# Patient Record
Sex: Female | Born: 1940 | Race: Black or African American | Hispanic: No | Smoking: Never smoker
Health system: Southern US, Community
[De-identification: ages and names within clinical notes are randomized; demographics above are authoritative.]

## PROBLEM LIST (undated history)

## (undated) DIAGNOSIS — I10 Essential (primary) hypertension: Secondary | ICD-10-CM

## (undated) DIAGNOSIS — E785 Hyperlipidemia, unspecified: Secondary | ICD-10-CM

## (undated) HISTORY — DX: Essential (primary) hypertension: I10

## (undated) HISTORY — DX: Hyperlipidemia, unspecified: E78.5

---

## 1985-05-27 HISTORY — PX: ABDOMINAL HYSTERECTOMY: SHX81

## 2004-03-12 ENCOUNTER — Ambulatory Visit: Payer: Self-pay | Admitting: Family Medicine

## 2004-07-31 ENCOUNTER — Ambulatory Visit: Payer: Self-pay | Admitting: Family Medicine

## 2004-09-14 ENCOUNTER — Ambulatory Visit: Payer: Self-pay | Admitting: Internal Medicine

## 2005-08-01 ENCOUNTER — Ambulatory Visit: Payer: Self-pay | Admitting: Family Medicine

## 2011-02-07 ENCOUNTER — Encounter (INDEPENDENT_AMBULATORY_CARE_PROVIDER_SITE_OTHER): Payer: Self-pay

## 2011-02-11 ENCOUNTER — Encounter (INDEPENDENT_AMBULATORY_CARE_PROVIDER_SITE_OTHER): Payer: Self-pay | Admitting: General Surgery

## 2011-02-11 ENCOUNTER — Ambulatory Visit (INDEPENDENT_AMBULATORY_CARE_PROVIDER_SITE_OTHER): Payer: Medicare Other | Admitting: General Surgery

## 2011-02-11 VITALS — BP 144/86 | HR 60 | Temp 97.0°F | Ht 65.0 in | Wt 134.5 lb

## 2011-02-11 DIAGNOSIS — D235 Other benign neoplasm of skin of trunk: Secondary | ICD-10-CM

## 2011-02-11 NOTE — Progress Notes (Signed)
Chief Complaint  Patient presents with  . Other    new pt eval of lipoma of upper right chest     HPI Deanna Carey is a 70 y.o. female.   HPI    She is self-referred. She has a soft tissue mass on the right upper chest wall in the infraclavicular region. She saw Dr. Francina Ames in 1999 for this. It was 6 cm at the time. It was decided not to excise it at that time. She states it is slowly growing. She is interested in having it removed. Sometimes it causes her some right chest wall pain.  Past Medical History  Diagnosis Date  . Hypertension   . Hyperlipidemia     Past Surgical History  Procedure Date  . Abdominal hysterectomy 1987    Family History  Problem Relation Age of Onset  . Heart disease Mother   . Heart disease Father     Social History History  Substance Use Topics  . Smoking status: Never Smoker   . Smokeless tobacco: Not on file  . Alcohol Use: No    Allergies  Allergen Reactions  . Sulfa Antibiotics     Hives, swelling, breathing constricted     Current Outpatient Prescriptions  Medication Sig Dispense Refill  . aspirin 81 MG tablet Take 81 mg by mouth daily.        Marland Kitchen CALCIUM PO Take by mouth.        . cholecalciferol (VITAMIN D) 1000 UNITS tablet Take 1,000 Units by mouth daily.        . clorazepate (TRANXENE) 7.5 MG tablet Take 7.5 mg by mouth 2 (two) times daily as needed.        Marland Kitchen lisinopril (PRINIVIL,ZESTRIL) 5 MG tablet       . MAGNESIUM PO Take by mouth.        Marland Kitchen VITAMIN E PO Take by mouth.        . simvastatin (ZOCOR) 20 MG tablet         Review of Systems Review of Systems  Constitutional: Negative.   Cardiovascular: Positive for chest pain.       Intermittent pain at soft tissue mass site.    Blood pressure 144/86, pulse 60, temperature 97 F (36.1 C), height 5\' 5"  (1.651 m), weight 134 lb 8 oz (61.009 kg).  Physical Exam Physical Exam  Constitutional: She appears well-developed and well-nourished. No distress.  Neck: No  JVD present. No tracheal deviation present.  Pulmonary/Chest:       9 cm by 7 cm right infraclavicular soft tissue mass that is mobile.  No supraclavicular adenopathy.  No right breast mass.  Lymphadenopathy:    She has no cervical adenopathy.    Data Reviewed Old Chart.  Assessment    Enlarging right upper chest wall mass.    Plan    Excision of right upper chest wall mass.      The procedure and risks were discussed with her. Risks include but are not limited to bleeding, infection, wound problems, anesthesia, injury to nerve or blood vessel, recurrence. She seems to understand and would like to proceed.  Orange Hilligoss J 02/11/2011, 11:21 AM

## 2011-02-25 ENCOUNTER — Other Ambulatory Visit (INDEPENDENT_AMBULATORY_CARE_PROVIDER_SITE_OTHER): Payer: Self-pay | Admitting: General Surgery

## 2011-02-25 NOTE — Telephone Encounter (Signed)
Called pt to clarify that her Rx would be written at d/c after surgery.  She understands.

## 2011-03-07 ENCOUNTER — Ambulatory Visit
Admission: RE | Admit: 2011-03-07 | Discharge: 2011-03-07 | Disposition: A | Discharge status: Home / Self Care | Payer: Medicare Other | Source: Ambulatory Visit | Attending: General Surgery | Admitting: General Surgery

## 2011-03-07 ENCOUNTER — Other Ambulatory Visit (INDEPENDENT_AMBULATORY_CARE_PROVIDER_SITE_OTHER): Payer: Self-pay | Admitting: General Surgery

## 2011-03-07 DIAGNOSIS — Z01811 Encounter for preprocedural respiratory examination: Secondary | ICD-10-CM

## 2011-03-14 ENCOUNTER — Telehealth (INDEPENDENT_AMBULATORY_CARE_PROVIDER_SITE_OTHER): Payer: Self-pay

## 2011-03-14 DIAGNOSIS — D249 Benign neoplasm of unspecified breast: Secondary | ICD-10-CM

## 2011-03-14 HISTORY — PX: BREAST SURGERY: SHX581

## 2011-03-14 NOTE — Telephone Encounter (Signed)
Patient's daughter called and states that they were told to ice the incision and keep the dressing dry.  They have been using the ice pack and the dressing feels moist.  She is concerned.  I spoke to Dr Abbey Chatters and he advised stop the ice and put a dry towel over the dressing to absorb the moisture.  I notified the daughter.

## 2011-03-27 ENCOUNTER — Encounter (INDEPENDENT_AMBULATORY_CARE_PROVIDER_SITE_OTHER): Payer: Self-pay | Admitting: General Surgery

## 2011-03-27 ENCOUNTER — Ambulatory Visit (INDEPENDENT_AMBULATORY_CARE_PROVIDER_SITE_OTHER): Payer: Medicare Other | Admitting: General Surgery

## 2011-03-27 DIAGNOSIS — D179 Benign lipomatous neoplasm, unspecified: Secondary | ICD-10-CM

## 2011-03-27 NOTE — Progress Notes (Signed)
Operation:  Removal of right chest wall/breast mass  Date:  03/14/11  Pathology:  Benign lipoma  HPI:  Ms. Deanna Carey is here for her first postoperative visit.  She is doing well and has no complaints. She had minimal swelling.   Physical Exam:  Right upper chest wall incision is clean and intact.  Assessment:  Wound is healing well and pathology is benign.  Plan:  May shower, activities as tolerated, return visit p.r.n.

## 2011-03-27 NOTE — Patient Instructions (Signed)
The mass was a benign lipoma.  You may shower.  You may resume your normal activities.

## 2011-05-06 ENCOUNTER — Other Ambulatory Visit: Payer: Self-pay | Admitting: Radiology

## 2011-05-10 ENCOUNTER — Encounter (INDEPENDENT_AMBULATORY_CARE_PROVIDER_SITE_OTHER): Payer: Self-pay | Admitting: General Surgery

## 2012-03-23 ENCOUNTER — Other Ambulatory Visit: Payer: Self-pay | Admitting: Diagnostic Neuroimaging

## 2012-03-23 DIAGNOSIS — R439 Unspecified disturbances of smell and taste: Secondary | ICD-10-CM

## 2012-03-26 ENCOUNTER — Ambulatory Visit
Admission: RE | Admit: 2012-03-26 | Discharge: 2012-03-26 | Disposition: A | Discharge status: Home / Self Care | Payer: Medicare Other | Source: Ambulatory Visit | Attending: Diagnostic Neuroimaging | Admitting: Diagnostic Neuroimaging

## 2012-03-26 DIAGNOSIS — R439 Unspecified disturbances of smell and taste: Secondary | ICD-10-CM

## 2012-03-26 MED ORDER — GADOBENATE DIMEGLUMINE 529 MG/ML IV SOLN
10.0000 mL | Freq: Once | INTRAVENOUS | Status: AC | PRN
  Administered 2012-03-26: 10 mL via INTRAVENOUS

## 2014-09-26 ENCOUNTER — Other Ambulatory Visit: Payer: Self-pay

## 2014-09-26 DIAGNOSIS — Z1231 Encounter for screening mammogram for malignant neoplasm of breast: Secondary | ICD-10-CM

## 2016-03-14 ENCOUNTER — Other Ambulatory Visit: Payer: Self-pay | Admitting: Internal Medicine

## 2016-03-14 DIAGNOSIS — R0989 Other specified symptoms and signs involving the circulatory and respiratory systems: Secondary | ICD-10-CM

## 2016-04-23 ENCOUNTER — Ambulatory Visit
Admission: RE | Admit: 2016-04-23 | Discharge: 2016-04-23 | Disposition: A | Discharge status: Home / Self Care | Payer: Medicare Other | Source: Ambulatory Visit | Attending: Internal Medicine | Admitting: Internal Medicine

## 2016-04-23 DIAGNOSIS — R0989 Other specified symptoms and signs involving the circulatory and respiratory systems: Secondary | ICD-10-CM

## 2016-05-01 ENCOUNTER — Other Ambulatory Visit: Payer: Self-pay

## 2020-02-25 ENCOUNTER — Other Ambulatory Visit: Payer: Self-pay | Admitting: Internal Medicine

## 2020-02-25 DIAGNOSIS — R1310 Dysphagia, unspecified: Secondary | ICD-10-CM

## 2020-03-01 ENCOUNTER — Ambulatory Visit
Admission: RE | Admit: 2020-03-01 | Discharge: 2020-03-01 | Disposition: A | Discharge status: Home / Self Care | Payer: Medicare Other | Source: Ambulatory Visit | Attending: Internal Medicine | Admitting: Internal Medicine

## 2020-03-01 DIAGNOSIS — R1310 Dysphagia, unspecified: Secondary | ICD-10-CM

## 2020-06-07 ENCOUNTER — Encounter: Payer: Self-pay | Admitting: Hematology and Oncology

## 2020-06-07 ENCOUNTER — Telehealth: Payer: Self-pay | Admitting: Hematology and Oncology

## 2020-06-07 NOTE — Telephone Encounter (Signed)
Received a new hem referral from Dr. Lysle Rubens for thrombocytopenia. I attempted to call Ms. Deanna Carey but the call will drop when she picked up the phone. I scheduled her to see Dr. Chryl Heck on 2/1 at [place]. I will mail the pt a letter.

## 2020-06-16 ENCOUNTER — Telehealth: Payer: Self-pay | Admitting: Hematology and Oncology

## 2020-06-16 NOTE — Telephone Encounter (Signed)
Deanna Carey cld to cancel her 2/1 appt. Pt has refused to come to see a hematologist. She didn't want to reschedule.

## 2020-06-27 ENCOUNTER — Encounter: Payer: Medicare Other | Admitting: Hematology and Oncology

## 2020-06-27 ENCOUNTER — Other Ambulatory Visit: Payer: Medicare Other

## 2020-10-25 DIAGNOSIS — E7800 Pure hypercholesterolemia, unspecified: Secondary | ICD-10-CM | POA: Diagnosis not present

## 2020-10-25 DIAGNOSIS — N183 Chronic kidney disease, stage 3 unspecified: Secondary | ICD-10-CM | POA: Diagnosis not present

## 2020-10-25 DIAGNOSIS — R634 Abnormal weight loss: Secondary | ICD-10-CM | POA: Diagnosis not present

## 2020-10-25 DIAGNOSIS — I10 Essential (primary) hypertension: Secondary | ICD-10-CM | POA: Diagnosis not present

## 2021-02-07 DIAGNOSIS — Z1231 Encounter for screening mammogram for malignant neoplasm of breast: Secondary | ICD-10-CM | POA: Diagnosis not present

## 2021-04-17 DIAGNOSIS — Z961 Presence of intraocular lens: Secondary | ICD-10-CM | POA: Diagnosis not present

## 2021-04-17 DIAGNOSIS — H16223 Keratoconjunctivitis sicca, not specified as Sjogren's, bilateral: Secondary | ICD-10-CM | POA: Diagnosis not present

## 2021-04-17 DIAGNOSIS — H18413 Arcus senilis, bilateral: Secondary | ICD-10-CM | POA: Diagnosis not present

## 2021-04-17 DIAGNOSIS — H26493 Other secondary cataract, bilateral: Secondary | ICD-10-CM | POA: Diagnosis not present

## 2021-04-26 DIAGNOSIS — M8580 Other specified disorders of bone density and structure, unspecified site: Secondary | ICD-10-CM | POA: Diagnosis not present

## 2021-04-26 DIAGNOSIS — Z1389 Encounter for screening for other disorder: Secondary | ICD-10-CM | POA: Diagnosis not present

## 2021-04-26 DIAGNOSIS — E7800 Pure hypercholesterolemia, unspecified: Secondary | ICD-10-CM | POA: Diagnosis not present

## 2021-04-26 DIAGNOSIS — J309 Allergic rhinitis, unspecified: Secondary | ICD-10-CM | POA: Diagnosis not present

## 2021-04-26 DIAGNOSIS — Z0000 Encounter for general adult medical examination without abnormal findings: Secondary | ICD-10-CM | POA: Diagnosis not present

## 2021-04-26 DIAGNOSIS — I10 Essential (primary) hypertension: Secondary | ICD-10-CM | POA: Diagnosis not present

## 2021-04-26 DIAGNOSIS — N183 Chronic kidney disease, stage 3 unspecified: Secondary | ICD-10-CM | POA: Diagnosis not present

## 2021-05-07 ENCOUNTER — Telehealth: Payer: Self-pay | Admitting: Hematology and Oncology

## 2021-05-07 NOTE — Telephone Encounter (Signed)
Scheduled appt per 12/2 referral. Spoke to pt's daughter, Jeral Fruit, who is aware of appt date and time.

## 2021-05-28 NOTE — Progress Notes (Signed)
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  She is a good historian, has very long answers to all the questions.  She denies any health complaints at all except for may be a gradual weight loss over the past couple years according to her daughter.  Review of systems completely unremarkable according to the patient.  Physical examination today, well-appearing female patient for her age with no significant findings.  I do agree she might have lost weight over sometimes and she has empty axillary fossa on my exam today. I have discussed with her about common causes of pancytopenia including but not limited to nutritional deficiencies, autoimmune diseases, thyroid problems, viral infections, bone marrow disorders.  She agrees for further work-up today.  If she does not have any clear etiology for pancytopenia, we have discussed about considering bone marrow aspiration and biopsy versus surveillance since she has mild cytopenias.  She is more inclined towards surveillance at this time.  She will return for follow-up, televisit in 1 week to discuss lab results.  If she chooses to proceed with surveillance based on the recommendations, will make another follow-up in about 3 to 4 months with repeat labs.  Thank you for consulting [place] the care of this patient.  Please do not hesitate to contact us with any additional questions or concerns.  Orders Placed This Encounter  Procedures   CBC with Differential/Platelet    Standing Status:   Standing    Number of Occurrences:   22    Standing Expiration Date:   05/29/2022   Comprehensive metabolic panel    Standing Status:    Standing    Number of Occurrences:   33    Standing Expiration Date:   05/29/2022   Ferritin    Standing Status:   Future    Standing Expiration Date:   05/29/2022   Iron and Iron Binding Capacity ([facility] only)    Standing Status:   Future    Standing Expiration Date:   05/29/2022   Vitamin B12    Standing Status:   Future    Standing Expiration Date:   05/29/2022   Folate RBC    Standing Status:   Future    Standing Expiration Date:   05/29/2022   Hepatitis panel, acute    Standing Status:   Future    Standing Expiration Date:   05/29/2022   Lactate dehydrogenase    Standing Status:   Future    Standing Expiration Date:   05/29/2022   Reticulocytes    Standing Status:   Future    Standing Expiration Date:   05/29/2022   TSH    Standing Status:   Standing    Number of Occurrences:   22    Standing Expiration Date:   05/29/2022   ANA, IFA (with reflex)    Standing Status:   Future    Standing Expiration Date:   05/29/2022   Pathologist smear review    Standing Status:   Future    Standing Expiration Date:   05/29/2022     HISTORY OF PRESENTING ILLNESS:  Deanna Carey 81 y.o. female is here because of pancytopenia.  This is a very pleasant 81 year old female patient  who arrived today with her daughter to the appointment referred for pancytopenia.  Patient is a good historian.  She denies any medical issues.  She was first referred to hematology back in January 2022 when she was noted to have mild thrombocytopenia but she elected to defer that appointment since she has been feeling well.  She was then noted to have pancytopenia on her most recent labs hence the referral was placed again.  She again did not want to come to the doctor however her daughter insisted that we get this checked out and hence she is here.  She denies any complaints at all.  No fevers, drenching night sweats, loss of appetite loss of weight.  Although the daughter tells me that patient has been slowly losing weight,  her baseline weight is about 130 pounds and now she weighs about 118 pounds, she might have lost this over the past 2 or 3 years. She denies any changes in breathing, bowel habits or urinary habits.  No history of autoimmune diseases, thyroid issues.  No known nutritional deficiencies.  No history of blood transfusions, hepatitis infection. Rest of the pertinent 10 point ROS reviewed and negative.  REVIEW OF SYSTEMS:   Constitutional: Denies fevers, chills or abnormal night sweats Eyes: Denies blurriness of vision, double vision or watery eyes Ears, nose, mouth, throat, and face: Denies mucositis or sore throat Respiratory: Denies cough, dyspnea or wheezes Cardiovascular: Denies palpitation, chest discomfort or lower extremity swelling Gastrointestinal:  Denies nausea, heartburn or change in bowel habits Skin: Denies abnormal skin rashes Lymphatics: Denies new lymphadenopathy or easy bruising Neurological:Denies numbness, tingling or new weaknesses Behavioral/Psych: Mood is stable, no new changes  All other systems were reviewed with the patient and are negative.  MEDICAL HISTORY:  Past Medical History:  Diagnosis Date   Hyperlipidemia    Hypertension    SURGICAL HISTORY: Past Surgical History:  Procedure Laterality Date   ABDOMINAL HYSTERECTOMY  1987   BREAST SURGERY  03/14/11   bx - right    SOCIAL HISTORY: Social History   Socioeconomic History   Marital status: Divorced    Spouse name: Not on file   Number of children: Not on file   Years of education: Not on file   Highest education level: Not on file  Occupational History   Not on file  Tobacco Use   Smoking status: Never   Smokeless tobacco: Never  Substance and Sexual Activity   Alcohol use: No   Drug use: No   Sexual activity: Not on file  Other Topics Concern   Not on file  Social History Narrative   Not on file   Social Determinants of Health   Financial Resource Strain: Not on file  Food  Insecurity: Not on file  Transportation Needs: Not on file  Physical Activity: Not on file  Stress: Not on file  Social Connections: Not on file  Intimate Partner Violence: Not on file    FAMILY HISTORY: Family History  Problem Relation Age of Onset   Heart disease Mother    Heart disease Father     ALLERGIES:  is allergic to sulfa antibiotics.  MEDICATIONS:  Current Outpatient Medications  Medication Sig Dispense Refill   CALCIUM PO Take by mouth.       cholecalciferol (VITAMIN D) 1000 UNITS tablet Take 1,000 Units by mouth daily.       clorazepate (TRANXENE) 7.5 MG tablet Take 7.5 mg by mouth 2 (two) times daily as needed.  lisinopril (PRINIVIL,ZESTRIL) 5 MG tablet 10 mg.     MAGNESIUM PO Take by mouth.       simvastatin (ZOCOR) 20 MG tablet      VITAMIN E PO Take by mouth.       No current facility-administered medications for this visit.    PHYSICAL EXAMINATION: ECOG PERFORMANCE STATUS: 0 - Asymptomatic  Vitals:   05/29/21 0956  BP: (!) 155/67  Pulse: 78  Resp: 17  Temp: 97.6 F (36.4 C)  SpO2: 99%   Filed Weights   05/29/21 0956  Weight: 118 lb 4 oz (53.6 kg)    GENERAL:alert, no distress and comfortable SKIN: skin color, texture, turgor are normal, no rashes or significant lesions EYES: normal, conjunctiva are pink and non-injected, sclera clear OROPHARYNX:no exudate, no erythema and lips, buccal mucosa, and tongue normal  NECK: supple, thyroid normal size, non-tender, without nodularity LYMPH:  no palpable lymphadenopathy in the cervical, axillary  LUNGS: clear to auscultation and percussion with normal breathing effort HEART: regular rate & rhythm and no murmurs and no lower extremity edema ABDOMEN:abdomen soft, non-tender and normal bowel sounds Musculoskeletal:no cyanosis of digits and no clubbing  PSYCH: alert & oriented x 3 with fluent speech NEURO: no focal motor/sensory deficits  LABORATORY DATA:  I have reviewed the data as listed No  results found for: WBC, HGB, HCT, MCV, PLT   Chemistry   No results found for: NA, K, CL, CO2, BUN, CREATININE, GLU No results found for: CALCIUM, ALKPHOS, AST, ALT, BILITOT   I have reviewed her labs from January 2022, normal white blood cell count, normal hemoglobin, platelet count of 120,000 back then.  Most recent labs show white blood count of 3800, hemoglobin of 11.5 and platelet count of 100,000.  RADIOGRAPHIC STUDIES: I have personally reviewed the radiological images as listed and agreed with the findings in the report. No results found.  All questions were answered. The patient knows to call the clinic with any problems, questions or concerns. I spent 45 minutes in the care of this patient including H and P, review of records, counseling and coordination of care.     Benay Pike, MD 05/29/2021 10:51 AM

## 2021-05-29 ENCOUNTER — Inpatient Hospital Stay: Payer: Medicare Other | Attending: Hematology and Oncology | Admitting: Hematology and Oncology

## 2021-05-29 ENCOUNTER — Encounter: Payer: Self-pay | Admitting: Hematology and Oncology

## 2021-05-29 ENCOUNTER — Inpatient Hospital Stay: Payer: Medicare Other

## 2021-05-29 ENCOUNTER — Other Ambulatory Visit: Payer: Self-pay

## 2021-05-29 VITALS — BP 155/67 | HR 78 | Temp 97.6°F | Resp 17 | Wt 118.2 lb

## 2021-05-29 DIAGNOSIS — I10 Essential (primary) hypertension: Secondary | ICD-10-CM | POA: Insufficient documentation

## 2021-05-29 DIAGNOSIS — R634 Abnormal weight loss: Secondary | ICD-10-CM | POA: Insufficient documentation

## 2021-05-29 DIAGNOSIS — D61818 Other pancytopenia: Secondary | ICD-10-CM

## 2021-05-29 DIAGNOSIS — E785 Hyperlipidemia, unspecified: Secondary | ICD-10-CM | POA: Insufficient documentation

## 2021-05-29 DIAGNOSIS — D696 Thrombocytopenia, unspecified: Secondary | ICD-10-CM | POA: Diagnosis not present

## 2021-05-29 LAB — CBC WITH DIFFERENTIAL/PLATELET
Abs Immature Granulocytes: 0.02 10*3/uL (ref 0.00–0.07)
Basophils Absolute: 0 10*3/uL (ref 0.0–0.1)
Basophils Relative: 1 %
Eosinophils Absolute: 0 10*3/uL (ref 0.0–0.5)
Eosinophils Relative: 1 %
HCT: 35 % — ABNORMAL LOW (ref 36.0–46.0)
Hemoglobin: 11.5 g/dL — ABNORMAL LOW (ref 12.0–15.0)
Immature Granulocytes: 1 %
Lymphocytes Relative: 42 %
Lymphs Abs: 1.3 10*3/uL (ref 0.7–4.0)
MCH: 28.3 pg (ref 26.0–34.0)
MCHC: 32.9 g/dL (ref 30.0–36.0)
MCV: 86 fL (ref 80.0–100.0)
Monocytes Absolute: 0.3 10*3/uL (ref 0.1–1.0)
Monocytes Relative: 10 %
Neutro Abs: 1.4 10*3/uL — ABNORMAL LOW (ref 1.7–7.7)
Neutrophils Relative %: 45 %
Platelets: 121 10*3/uL — ABNORMAL LOW (ref 150–400)
RBC: 4.07 MIL/uL (ref 3.87–5.11)
RDW: 14.2 % (ref 11.5–15.5)
WBC: 3.1 10*3/uL — ABNORMAL LOW (ref 4.0–10.5)
nRBC: 0 % (ref 0.0–0.2)

## 2021-05-29 LAB — COMPREHENSIVE METABOLIC PANEL
ALT: 7 U/L (ref 0–44)
AST: 16 U/L (ref 15–41)
Albumin: 4.7 g/dL (ref 3.5–5.0)
Alkaline Phosphatase: 44 U/L (ref 38–126)
Anion gap: 6 (ref 5–15)
BUN: 19 mg/dL (ref 8–23)
CO2: 30 mmol/L (ref 22–32)
Calcium: 10.5 mg/dL — ABNORMAL HIGH (ref 8.9–10.3)
Chloride: 105 mmol/L (ref 98–111)
Creatinine, Ser: 1.35 mg/dL — ABNORMAL HIGH (ref 0.44–1.00)
GFR, Estimated: 39 mL/min — ABNORMAL LOW (ref >60)
Glucose, Bld: 84 mg/dL (ref 70–99)
Potassium: 4.1 mmol/L (ref 3.5–5.1)
Sodium: 141 mmol/L (ref 135–145)
Total Bilirubin: 0.5 mg/dL (ref 0.3–1.2)
Total Protein: 7.5 g/dL (ref 6.5–8.1)

## 2021-05-29 LAB — LACTATE DEHYDROGENASE: LDH: 183 U/L (ref 98–192)

## 2021-05-29 LAB — RETICULOCYTES
Immature Retic Fract: 16.5 % — ABNORMAL HIGH (ref 2.3–15.9)
RBC.: 4.06 MIL/uL (ref 3.87–5.11)
Retic Count, Absolute: 48.3 10*3/uL (ref 19.0–186.0)
Retic Ct Pct: 1.2 % (ref 0.4–3.1)

## 2021-05-29 LAB — TSH: TSH: 0.675 u[IU]/mL (ref 0.308–3.960)

## 2021-05-29 LAB — IRON AND IRON BINDING CAPACITY (CC-WL,HP ONLY)
Iron: 78 ug/dL (ref 28–170)
Saturation Ratios: 25 % (ref 10.4–31.8)
TIBC: 318 ug/dL (ref 250–450)
UIBC: 240 ug/dL (ref 148–442)

## 2021-05-29 LAB — HEPATITIS PANEL, ACUTE
HCV Ab: NONREACTIVE
Hep A IgM: NONREACTIVE
Hep B C IgM: NONREACTIVE
Hepatitis B Surface Ag: NONREACTIVE

## 2021-05-29 LAB — FERRITIN: Ferritin: 126 ng/mL (ref 11–307)

## 2021-05-29 LAB — VITAMIN B12: Vitamin B-12: 428 pg/mL (ref 180–914)

## 2021-05-30 LAB — FOLATE RBC
Folate, Hemolysate: 237 ng/mL
Folate, RBC: 648 ng/mL (ref >498)
Hematocrit: 36.6 % (ref 34.0–46.6)

## 2021-05-30 LAB — PATHOLOGIST SMEAR REVIEW

## 2021-05-30 LAB — ANTINUCLEAR ANTIBODIES, IFA: ANA Ab, IFA: NEGATIVE

## 2021-06-08 ENCOUNTER — Encounter: Payer: Self-pay | Admitting: Hematology and Oncology

## 2021-06-08 ENCOUNTER — Inpatient Hospital Stay (HOSPITAL_BASED_OUTPATIENT_CLINIC_OR_DEPARTMENT_OTHER): Payer: Medicare Other | Admitting: Hematology and Oncology

## 2021-06-08 DIAGNOSIS — D61818 Other pancytopenia: Secondary | ICD-10-CM | POA: Diagnosis not present

## 2021-06-08 NOTE — Progress Notes (Signed)
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   During her initial visit we have discussed about considering some evaluation for liver disease, nutritional deficiency, hemolysis, hypothyroidism, autoimmune panel.  She is here for telephone visit to review labs and to discuss any additional recommendations.  Since her last visit, she has been feeling quite well, no complaints. Physical examination not done, telephone visit. I have reviewed her labs which showed no evidence of nutritional deficiency, no evidence of hemolysis, acute hepatitis panel not reactive, no evidence of hypothyroidism or ANA negative.  Path G review showed pancytopenia without any other specific findings.  I have reviewed her labs today with her and her daughter who are both present on the phone.  We have discussed about considering bone marrow aspiration and biopsy for further evaluation versus surveillance since her cytopenias are relatively mild.  She is leaning towards surveillance.  She would like to come back in 3 months.  She was however encouraged to contact us with any worsening questions or concerns and she expressed understanding.  Thank you for consulting on the care of this patient.  Please do not hesitate to contact us with any additional questions or concerns.  In basket message sent to our scheduler's team to schedule next follow-up in 3 months.  HISTORY OF PRESENTING ILLNESS:  Deanna Carey 81 y.o. female is here because of pancytopenia.  This is a very pleasant 81 year old female patient who arrived today with her daughter to the appointment referred for pancytopenia.     Since last visit, no new health concerns.  She feels quite well for her age and is very active.  No interim infections or hospitalizations.  Rest of the pertinent 10 point ROS reviewed and negative.  REVIEW OF SYSTEMS:   Constitutional: Denies fevers, chills or abnormal night sweats Eyes: Denies blurriness of vision, double vision or watery eyes Ears, nose, mouth, throat, and face: Denies mucositis or sore throat Respiratory: Denies cough, dyspnea or wheezes Cardiovascular: Denies palpitation, chest discomfort or lower extremity swelling Gastrointestinal:  Denies nausea, heartburn or change in bowel habits Skin: Denies abnormal skin rashes Lymphatics: Denies new lymphadenopathy or easy bruising Neurological:Denies numbness, tingling or new weaknesses Behavioral/Psych: Mood is stable, no new changes  All other systems were reviewed with the patient and are negative.  MEDICAL HISTORY:  Past Medical History:  Diagnosis Date   Hyperlipidemia    Hypertension    SURGICAL HISTORY: Past Surgical History:  Procedure Laterality Date   ABDOMINAL HYSTERECTOMY  1987   BREAST SURGERY  03/14/11   bx - right    SOCIAL HISTORY: Social History   Socioeconomic History   Marital status: Divorced    Spouse name: Not on file   Number of children: Not on file   Years of education: Not on file   Highest education level: Not on file  Occupational History   Not on file  Tobacco Use   Smoking status: Never   Smokeless tobacco: Never  Substance and Sexual Activity   Alcohol use: No   Drug use: No   Sexual activity: Not on file  Other Topics Concern   Not on file  Social History Narrative   Not on file  Social Determinants of Health   Financial Resource Strain: Not on file  Food Insecurity: Not on file  Transportation Needs: Not on file  Physical Activity: Not on file  Stress: Not on file  Social Connections: Not on file  Intimate Partner Violence: Not on file    FAMILY  HISTORY: Family History  Problem Relation Age of Onset   Heart disease Mother    Heart disease Father     ALLERGIES:  is allergic to sulfa antibiotics.  MEDICATIONS:  Current Outpatient Medications  Medication Sig Dispense Refill   CALCIUM PO Take by mouth.       cholecalciferol (VITAMIN D) 1000 UNITS tablet Take 1,000 Units by mouth daily.       clorazepate (TRANXENE) 7.5 MG tablet Take 7.5 mg by mouth 2 (two) times daily as needed.       lisinopril (PRINIVIL,ZESTRIL) 5 MG tablet 10 mg.     MAGNESIUM PO Take by mouth.       simvastatin (ZOCOR) 20 MG tablet      VITAMIN E PO Take by mouth.       No current facility-administered medications for this visit.    PHYSICAL EXAMINATION: ECOG PERFORMANCE STATUS: 0 - Asymptomatic  Vital signs and physical examination not done, telephone visit   LABORATORY DATA:  I have reviewed the data as listed Lab Results  Component Value Date   WBC 3.1 (L) 05/29/2021   HGB 11.5 (L) 05/29/2021   HCT 36.6 05/29/2021   HCT 35.0 (L) 05/29/2021   MCV 86.0 05/29/2021   PLT 121 (L) 05/29/2021     Chemistry      Component Value Date/Time   NA 141 05/29/2021 1111   K 4.1 05/29/2021 1111   CL 105 05/29/2021 1111   CO2 30 05/29/2021 1111   BUN 19 05/29/2021 1111   CREATININE 1.35 (H) 05/29/2021 1111      Component Value Date/Time   CALCIUM 10.5 (H) 05/29/2021 1111   ALKPHOS 44 05/29/2021 1111   AST 16 05/29/2021 1111   ALT 7 05/29/2021 1111   BILITOT 0.5 05/29/2021 1111     I have reviewed her labs from January 2022, normal white blood cell count, normal hemoglobin, platelet count of 120,000 back then.  Most recent labs show white blood count of 3800, hemoglobin of 11.5 and platelet count of 100,000.  RADIOGRAPHIC STUDIES: I have personally reviewed the radiological images as listed and agreed with the findings in the report. No results found.  I connected with  Deanna Carey on 06/08/21 by a telephone application and verified  that I am speaking with the correct person using two identifiers.   I discussed the limitations of evaluation and management by telemedicine. The patient expressed understanding and agreed to proceed.   All questions were answered. The patient knows to call the clinic with any problems, questions or concerns. I spent 12 minutes in the care of this patient including review of records, counseling and coordination of care    Benay Pike, MD 06/08/2021 1:43 PM

## 2021-09-07 ENCOUNTER — Inpatient Hospital Stay: Payer: Medicare Other

## 2021-09-07 ENCOUNTER — Inpatient Hospital Stay: Payer: Medicare Other | Admitting: Hematology and Oncology

## 2021-09-26 DIAGNOSIS — J309 Allergic rhinitis, unspecified: Secondary | ICD-10-CM | POA: Diagnosis not present

## 2021-09-26 DIAGNOSIS — I10 Essential (primary) hypertension: Secondary | ICD-10-CM | POA: Diagnosis not present

## 2021-09-26 DIAGNOSIS — D696 Thrombocytopenia, unspecified: Secondary | ICD-10-CM | POA: Diagnosis not present

## 2021-09-26 DIAGNOSIS — D61818 Other pancytopenia: Secondary | ICD-10-CM | POA: Diagnosis not present

## 2021-09-26 DIAGNOSIS — N183 Chronic kidney disease, stage 3 unspecified: Secondary | ICD-10-CM | POA: Diagnosis not present

## 2021-10-17 ENCOUNTER — Other Ambulatory Visit: Payer: Self-pay

## 2021-10-17 ENCOUNTER — Inpatient Hospital Stay: Payer: Medicare Other | Admitting: Hematology and Oncology

## 2021-10-17 ENCOUNTER — Inpatient Hospital Stay: Payer: Medicare Other | Attending: Hematology and Oncology

## 2021-10-17 VITALS — BP 160/69 | HR 62 | Temp 97.7°F | Resp 16 | Ht 65.0 in | Wt 111.8 lb

## 2021-10-17 DIAGNOSIS — D61818 Other pancytopenia: Secondary | ICD-10-CM

## 2021-10-17 DIAGNOSIS — R634 Abnormal weight loss: Secondary | ICD-10-CM | POA: Diagnosis not present

## 2021-10-17 DIAGNOSIS — R630 Anorexia: Secondary | ICD-10-CM | POA: Diagnosis not present

## 2021-10-17 LAB — CBC WITH DIFFERENTIAL/PLATELET
Abs Immature Granulocytes: 0.02 10*3/uL (ref 0.00–0.07)
Basophils Absolute: 0 10*3/uL (ref 0.0–0.1)
Basophils Relative: 1 %
Eosinophils Absolute: 0 10*3/uL (ref 0.0–0.5)
Eosinophils Relative: 0 %
HCT: 30.5 % — ABNORMAL LOW (ref 36.0–46.0)
Hemoglobin: 10.4 g/dL — ABNORMAL LOW (ref 12.0–15.0)
Immature Granulocytes: 1 %
Lymphocytes Relative: 49 %
Lymphs Abs: 1.6 10*3/uL (ref 0.7–4.0)
MCH: 28.8 pg (ref 26.0–34.0)
MCHC: 34.1 g/dL (ref 30.0–36.0)
MCV: 84.5 fL (ref 80.0–100.0)
Monocytes Absolute: 0.3 10*3/uL (ref 0.1–1.0)
Monocytes Relative: 10 %
Neutro Abs: 1.2 10*3/uL — ABNORMAL LOW (ref 1.7–7.7)
Neutrophils Relative %: 39 %
Platelets: 115 10*3/uL — ABNORMAL LOW (ref 150–400)
RBC: 3.61 MIL/uL — ABNORMAL LOW (ref 3.87–5.11)
RDW: 13.7 % (ref 11.5–15.5)
Smear Review: NORMAL
WBC: 3.1 10*3/uL — ABNORMAL LOW (ref 4.0–10.5)
nRBC: 0 % (ref 0.0–0.2)

## 2021-10-17 LAB — COMPREHENSIVE METABOLIC PANEL
ALT: 10 U/L (ref 0–44)
AST: 20 U/L (ref 15–41)
Albumin: 4.8 g/dL (ref 3.5–5.0)
Alkaline Phosphatase: 37 U/L — ABNORMAL LOW (ref 38–126)
Anion gap: 8 (ref 5–15)
BUN: 17 mg/dL (ref 8–23)
CO2: 26 mmol/L (ref 22–32)
Calcium: 9.6 mg/dL (ref 8.9–10.3)
Chloride: 103 mmol/L (ref 98–111)
Creatinine, Ser: 1.29 mg/dL — ABNORMAL HIGH (ref 0.44–1.00)
GFR, Estimated: 41 mL/min — ABNORMAL LOW (ref >60)
Glucose, Bld: 103 mg/dL — ABNORMAL HIGH (ref 70–99)
Potassium: 3.9 mmol/L (ref 3.5–5.1)
Sodium: 137 mmol/L (ref 135–145)
Total Bilirubin: 0.6 mg/dL (ref 0.3–1.2)
Total Protein: 7.6 g/dL (ref 6.5–8.1)

## 2021-10-17 NOTE — Progress Notes (Signed)
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   Initial evaluation with no clear etiology for cytopenias. BMB discussed but patient declined She is here for a follow up. She denies any new complaints.  She continues to lose weight on review however. Physical examination today again MD axillary foci as well as supraclavicular foci consistent with weight loss, otherwise no findings.  We have reviewed CBC from today which shows stable cytopenias except for slight drop in hemoglobin.  I have once again discussed today about considering bone marrow aspiration biopsy versus surveillance.  Patient downgraded declined bone marrow aspiration biopsy since she feels well.  She also wants to continue surveillance with her PCP, she does not understand why she has to come here for follow-up labs so frequently.   She was instructed to follow-up with her PCP for labs in that case and return to clinic with [place] as needed or in 1 year which she was agreeable to.  She understands that sometimes waiting for the clinical symptoms could delay the diagnosis.  She tells me that she believes in God and she is hoping for everything to be fine.  Thank you for consulting on the care of this patient.  Please do not hesitate to contact us with any additional questions or concerns.  Based on her request, she will come back in 1 year.  Daughter was instructed to talk to the PCP about monitoring.  She tells me that she feels quite well.  No B symptoms.  HISTORY OF PRESENTING ILLNESS:  Deanna Carey 81 y.o. female is here because of pancytopenia.  This is a very pleasant 81 year old female  patient who arrived today with her daughter to the appointment referred for pancytopenia.    Since last visit, no new health concerns.  She continues to feel quite well, no B symptoms. No loss of appetite although she eats small quantities.  She attributes the loss of appetite and weight loss secondary to her age.  She is independent and active.   Rest of the pertinent 10 point ROS reviewed and negative.   MEDICAL HISTORY:  Past Medical History:  Diagnosis Date   Hyperlipidemia    Hypertension    SURGICAL HISTORY: Past Surgical History:  Procedure Laterality Date   ABDOMINAL HYSTERECTOMY  1987   BREAST SURGERY  03/14/11   bx - right    SOCIAL HISTORY: Social History   Socioeconomic History   Marital status: Divorced    Spouse name: Not on file   Number of children: Not on file   Years of education: Not on file   Highest education level: Not on file  Occupational History   Not on file  Tobacco Use   Smoking status: Never   Smokeless tobacco: Never  Substance and Sexual Activity   Alcohol use: No   Drug use: No   Sexual activity: Not on file  Other Topics Concern   Not on file  Social History Narrative   Not on file   Social Determinants of Health   Financial Resource Strain: Not on file  Food Insecurity: Not on file  Transportation Needs: Not on file  Physical Activity: Not on file  Stress: Not on file  Social Connections: Not on file  Intimate Partner Violence: Not on file    FAMILY HISTORY: Family History  Problem Relation Age of Onset   Heart disease Mother    Heart disease Father     ALLERGIES:  is allergic to sulfa antibiotics.  MEDICATIONS:  Current Outpatient Medications  Medication Sig Dispense Refill   CALCIUM PO Take by mouth.       cholecalciferol (VITAMIN D) 1000 UNITS tablet Take 1,000 Units by mouth daily.       clorazepate (TRANXENE) 7.5 MG tablet Take 7.5 mg by mouth 2 (two) times daily as needed.       lisinopril  (PRINIVIL,ZESTRIL) 5 MG tablet 10 mg.     MAGNESIUM PO Take by mouth.       simvastatin (ZOCOR) 20 MG tablet      VITAMIN E PO Take by mouth.       No current facility-administered medications for this visit.    PHYSICAL EXAMINATION: ECOG PERFORMANCE STATUS: 0 - Asymptomatic  Physical Exam Constitutional:      Appearance: Normal appearance.  Cardiovascular:     Rate and Rhythm: Normal rate and regular rhythm.     Pulses: Normal pulses.     Heart sounds: Normal heart sounds.  Pulmonary:     Effort: Pulmonary effort is normal.     Breath sounds: Normal breath sounds.  Abdominal:     General: Abdomen is flat. Bowel sounds are normal.  Musculoskeletal:     Cervical back: Normal range of motion and neck supple. No rigidity.  Lymphadenopathy:     Cervical: No cervical adenopathy.  Skin:    General: Skin is warm and dry.  Neurological:     General: No focal deficit present.     Mental Status: She is alert.      LABORATORY DATA:  I have reviewed the data as listed Lab Results  Component Value Date   WBC 3.1 (L) 10/17/2021   HGB 10.4 (L) 10/17/2021   HCT 30.5 (L) 10/17/2021   MCV 84.5 10/17/2021   PLT 115 (L) 10/17/2021     Chemistry      Component Value Date/Time   NA 137 10/17/2021 1510   K 3.9 10/17/2021 1510   CL 103 10/17/2021 1510   CO2 26 10/17/2021 1510   BUN 17 10/17/2021 1510   CREATININE 1.29 (H) 10/17/2021 1510      Component Value Date/Time   CALCIUM 9.6 10/17/2021 1510   ALKPHOS 37 (L) 10/17/2021 1510   AST 20 10/17/2021 1510   ALT 10 10/17/2021 1510   BILITOT 0.6 10/17/2021 1510     I have reviewed her labs from January 2022, normal white blood cell count, normal hemoglobin, platelet count of 120,000 back then.  Most recent labs show white blood count of 3800, hemoglobin of 11.5 and platelet count of 100,000.  Discussed labs from today.  RADIOGRAPHIC STUDIES: I have personally reviewed the radiological images as listed and agreed with the  findings in the report. No results found.  All questions were answered. The patient knows to call the clinic with any problems, questions or concerns. I spent 30 minutes in the care of this patient including review of records, counseling and coordination of care    Benay Pike, MD 10/17/2021 4:11 PM

## 2021-10-18 ENCOUNTER — Encounter: Payer: Self-pay | Admitting: Hematology and Oncology

## 2021-10-18 LAB — TSH: TSH: 0.684 u[IU]/mL (ref 0.350–4.500)

## 2022-01-20 IMAGING — RF DG UGI W/ HIGH DENSITY W/O KUB
7 series · 14 of 24 positions shown · non-contrast
Comparison: None.

CLINICAL DATA: Dysphagia with difficulty chewing since medication
change.

EXAM:
UPPER GI SERIES WITH KUB
TECHNIQUE: After obtaining a scout radiograph a routine upper GI series was
performed using thin and high density barium.
FLUOROSCOPY TIME:  Fluoroscopy Time: 1 minutes and 30 seconds of
low-dose pulsed fluoroscopy.
Radiation Exposure Index (if provided by the fluoroscopic device):
114 mGy
Number of Acquired Spot Images: 0

[Series 1: one shot · 0.14mm/px · 1 of 1 slices shown (1 of 4)]
[im 1/1]
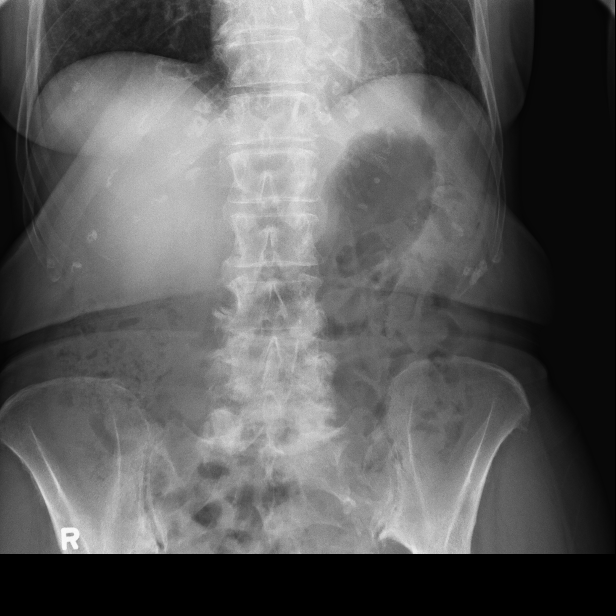

[Series 2: sequence · 1 of 18 frames shown (1 of 3)]
[frame 14/18]
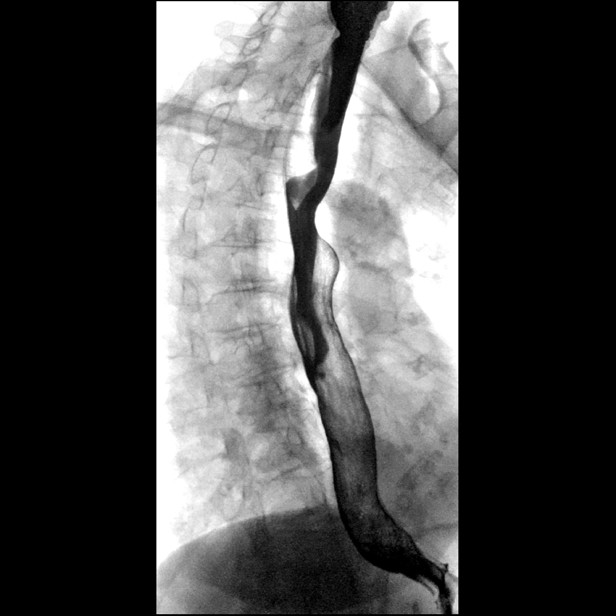

[Series 3: one shot · 4 of 7 slices shown (2 of 4)]
[im 1/7]
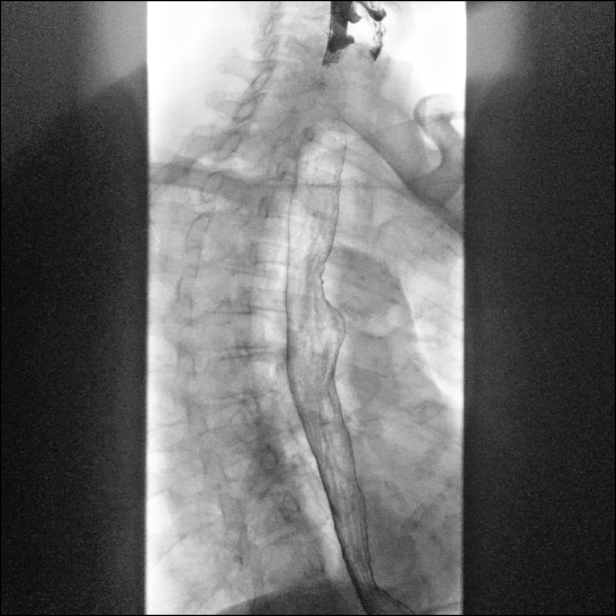
[im 4/7]
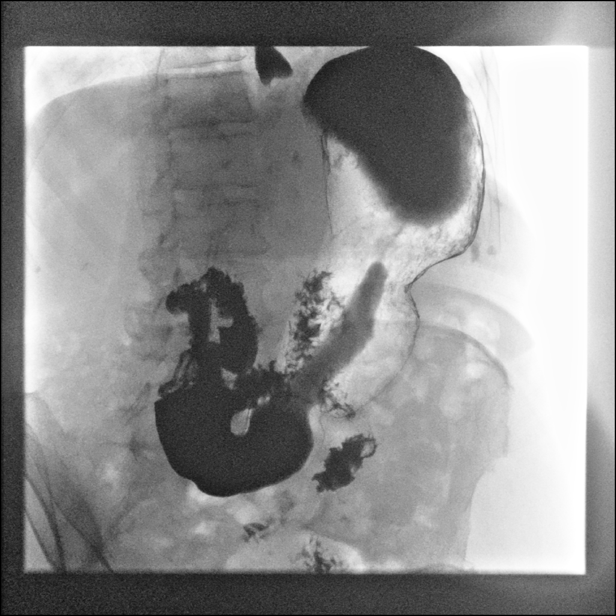
[im 5/7]
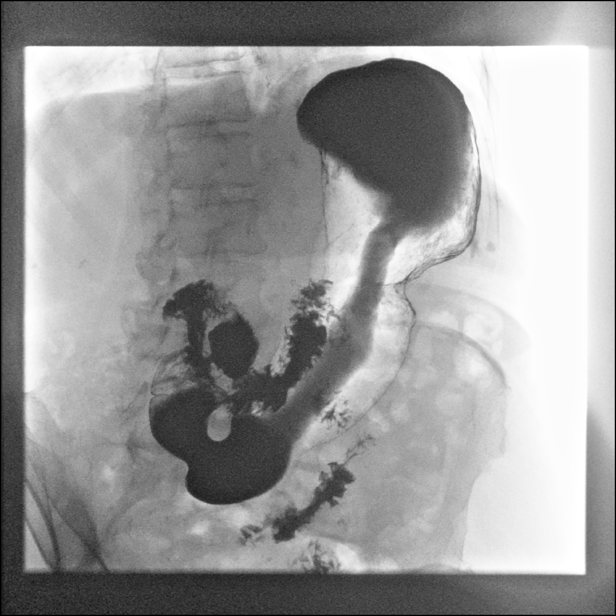
[im 7/7]
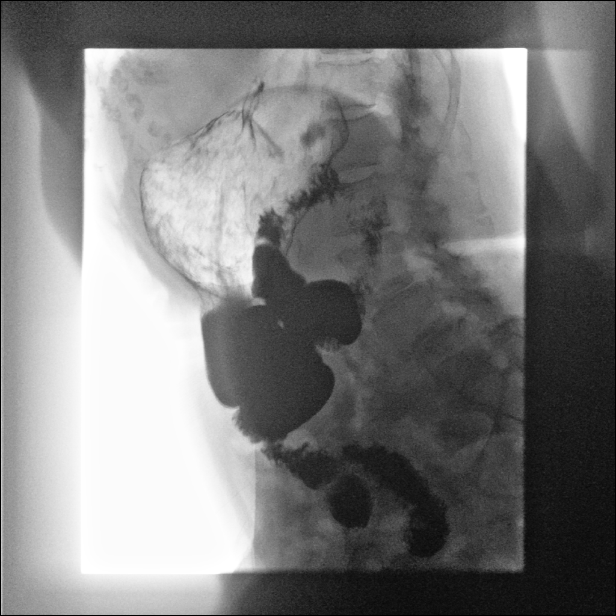

[Series 4: sequence · 2 of 28 frames shown (2 of 3)]
[frame 16/28]
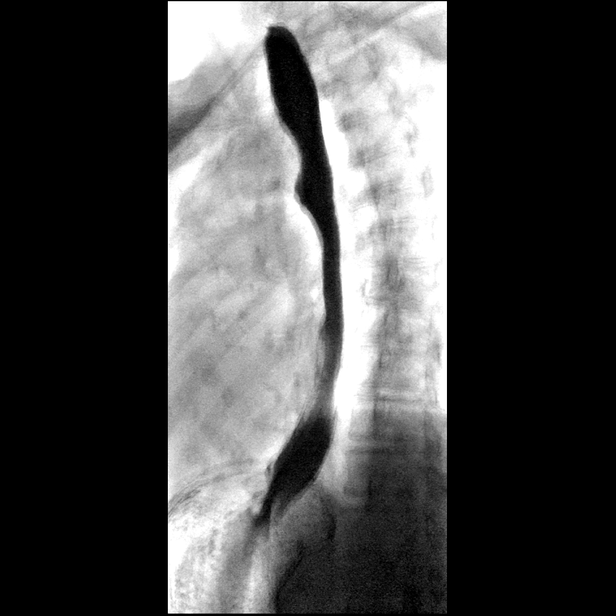
[frame 24/28]
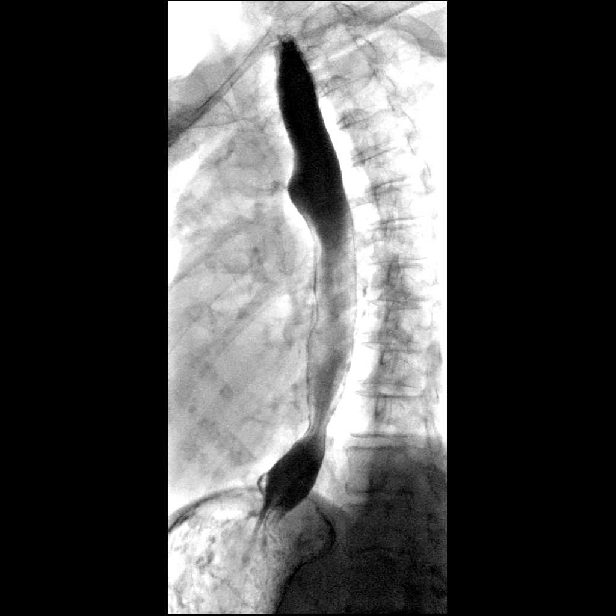

[Series 5: one shot · 2 of 7 slices shown (3 of 4)]
[im 3/7]
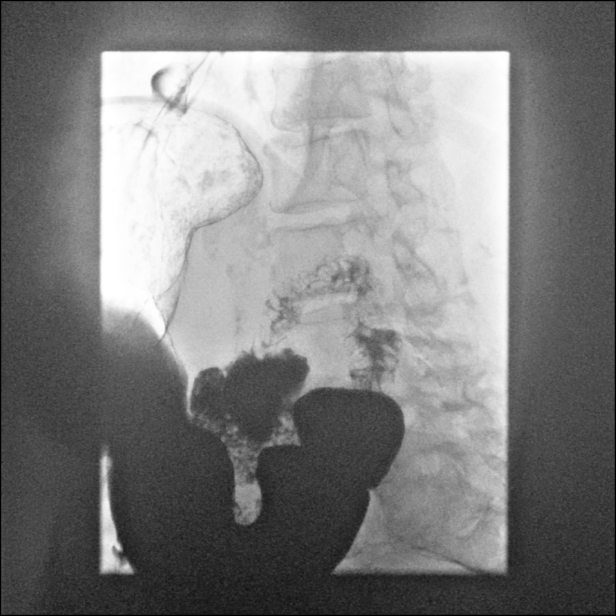
[im 5/7]
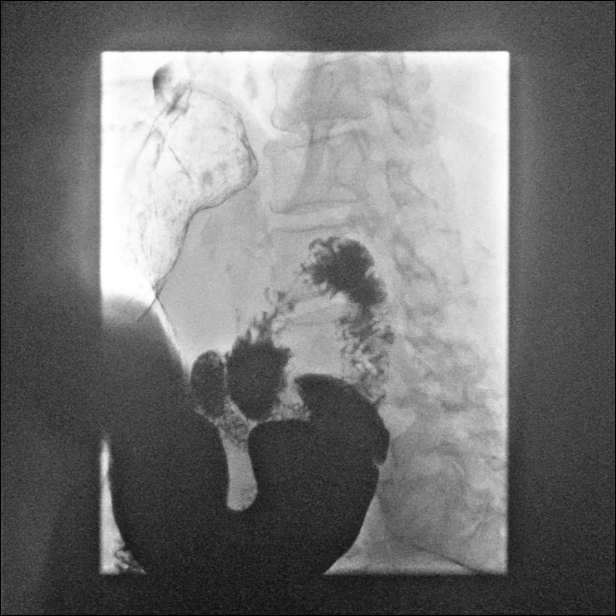

[Series 6: sequence · 3 of 10 frames shown (3 of 3)]
[frame 2/10]
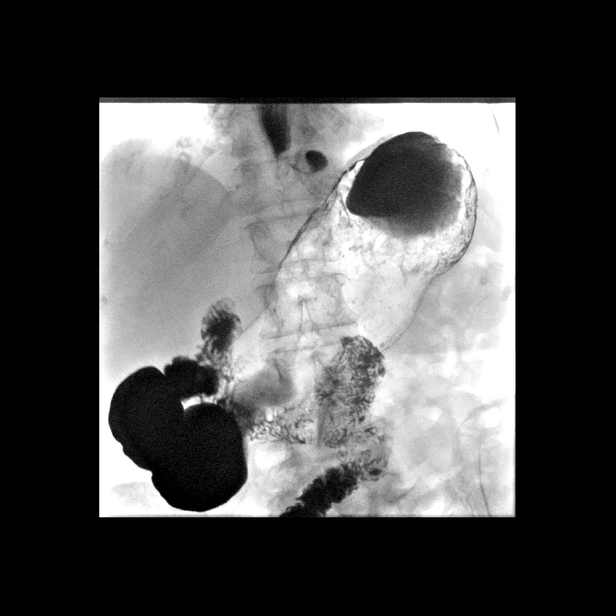
[frame 6/10]
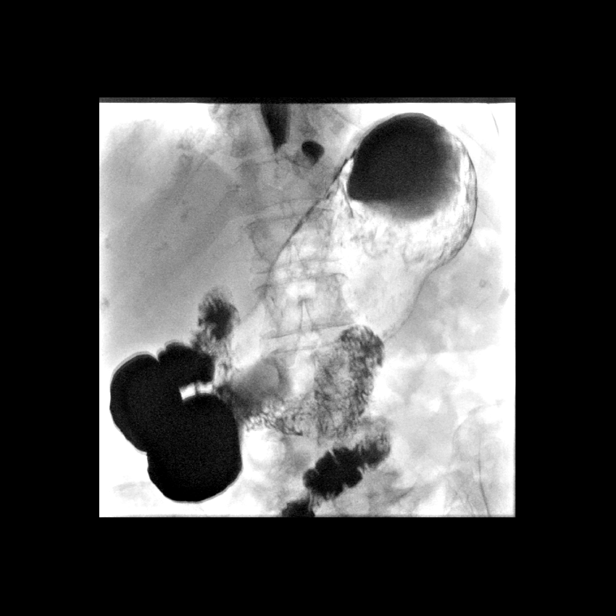
[frame 10/10]
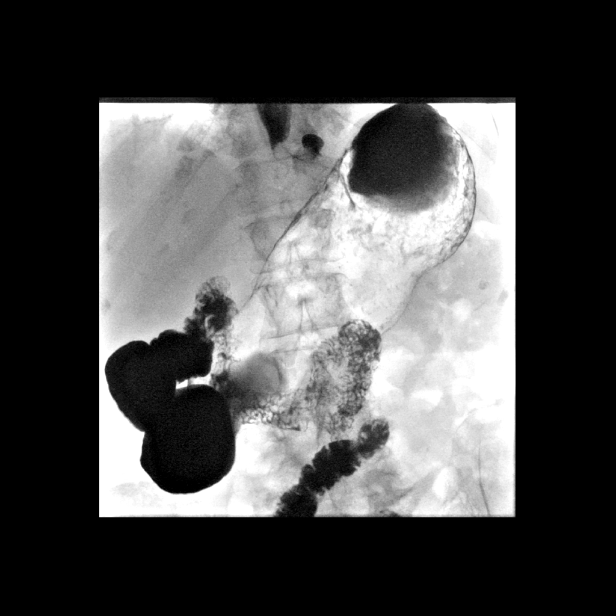

[Series 7: one shot · 1 of 3 slices shown (4 of 4)]
[im 3/3]
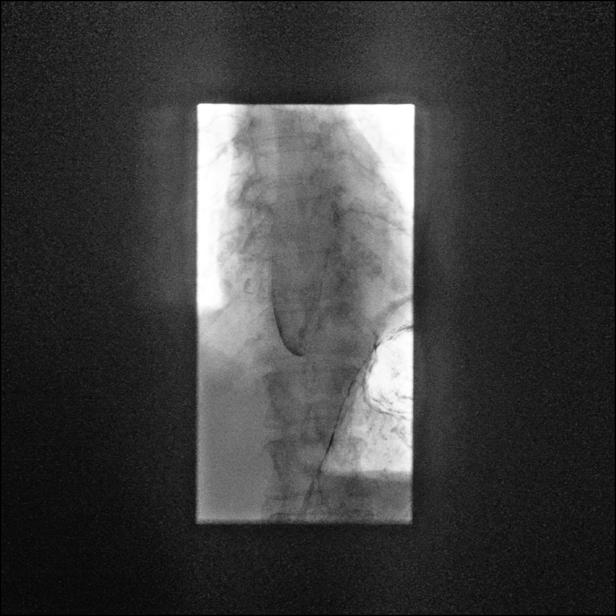

[14 of 24 positions shown; findings below may reference images not displayed]

FINDINGS: The scout abdominal radiograph demonstrates a normal bowel gas
pattern. There are degenerative changes within the lumbar spine and
sacroiliac joints. No suspicious abdominal calcifications.

The patient swallowed the barium without difficulty. The esophageal
motility is normal. There is no stricture, mass or ulceration. There
is a small epiphrenic diverticulum of the distal esophagus, best
seen on the supine images. No significant hiatal hernia.

The stomach and duodenum appear normal without ulceration.

Flash penetration was noted on the RAO images. There was no
aspiration. A 13 mm barium tablet was administered and passed
without delay into the stomach.
IMPRESSION: Near normal study without significant anatomic abnormalities. Flash
penetration noted, likely incidental. Small epiphrenic diverticulum
of the distal esophagus.

## 2022-03-25 ENCOUNTER — Other Ambulatory Visit: Payer: Self-pay | Admitting: Internal Medicine

## 2022-03-25 DIAGNOSIS — Z1231 Encounter for screening mammogram for malignant neoplasm of breast: Secondary | ICD-10-CM

## 2022-04-23 DIAGNOSIS — Z961 Presence of intraocular lens: Secondary | ICD-10-CM | POA: Diagnosis not present

## 2022-04-23 DIAGNOSIS — H16223 Keratoconjunctivitis sicca, not specified as Sjogren's, bilateral: Secondary | ICD-10-CM | POA: Diagnosis not present

## 2022-04-23 DIAGNOSIS — H02831 Dermatochalasis of right upper eyelid: Secondary | ICD-10-CM | POA: Diagnosis not present

## 2022-04-23 DIAGNOSIS — H26493 Other secondary cataract, bilateral: Secondary | ICD-10-CM | POA: Diagnosis not present

## 2022-05-02 DIAGNOSIS — D696 Thrombocytopenia, unspecified: Secondary | ICD-10-CM | POA: Diagnosis not present

## 2022-05-02 DIAGNOSIS — E7800 Pure hypercholesterolemia, unspecified: Secondary | ICD-10-CM | POA: Diagnosis not present

## 2022-05-02 DIAGNOSIS — E46 Unspecified protein-calorie malnutrition: Secondary | ICD-10-CM | POA: Diagnosis not present

## 2022-05-02 DIAGNOSIS — I10 Essential (primary) hypertension: Secondary | ICD-10-CM | POA: Diagnosis not present

## 2022-05-02 DIAGNOSIS — N1831 Chronic kidney disease, stage 3a: Secondary | ICD-10-CM | POA: Diagnosis not present

## 2022-05-02 DIAGNOSIS — Z0000 Encounter for general adult medical examination without abnormal findings: Secondary | ICD-10-CM | POA: Diagnosis not present

## 2022-05-02 DIAGNOSIS — M8580 Other specified disorders of bone density and structure, unspecified site: Secondary | ICD-10-CM | POA: Diagnosis not present

## 2022-05-02 DIAGNOSIS — G4700 Insomnia, unspecified: Secondary | ICD-10-CM | POA: Diagnosis not present

## 2022-05-02 DIAGNOSIS — J309 Allergic rhinitis, unspecified: Secondary | ICD-10-CM | POA: Diagnosis not present

## 2022-05-24 ENCOUNTER — Ambulatory Visit: Payer: Medicare Other

## 2022-07-03 ENCOUNTER — Ambulatory Visit
Admission: RE | Admit: 2022-07-03 | Discharge: 2022-07-03 | Disposition: A | Discharge status: Home / Self Care | Payer: Medicare Other | Source: Ambulatory Visit | Attending: Internal Medicine | Admitting: Internal Medicine

## 2022-07-03 DIAGNOSIS — Z1231 Encounter for screening mammogram for malignant neoplasm of breast: Secondary | ICD-10-CM | POA: Diagnosis not present

## 2022-09-06 DIAGNOSIS — E46 Unspecified protein-calorie malnutrition: Secondary | ICD-10-CM | POA: Diagnosis not present

## 2022-09-06 DIAGNOSIS — D61818 Other pancytopenia: Secondary | ICD-10-CM | POA: Diagnosis not present

## 2022-09-06 DIAGNOSIS — D696 Thrombocytopenia, unspecified: Secondary | ICD-10-CM | POA: Diagnosis not present

## 2022-09-06 DIAGNOSIS — N1831 Chronic kidney disease, stage 3a: Secondary | ICD-10-CM | POA: Diagnosis not present

## 2022-09-06 DIAGNOSIS — E7800 Pure hypercholesterolemia, unspecified: Secondary | ICD-10-CM | POA: Diagnosis not present

## 2022-09-06 DIAGNOSIS — I10 Essential (primary) hypertension: Secondary | ICD-10-CM | POA: Diagnosis not present

## 2022-10-22 ENCOUNTER — Inpatient Hospital Stay: Payer: Medicare Other | Attending: Hematology and Oncology | Admitting: Hematology and Oncology

## 2022-11-08 DIAGNOSIS — I10 Essential (primary) hypertension: Secondary | ICD-10-CM | POA: Diagnosis not present

## 2022-11-08 DIAGNOSIS — E46 Unspecified protein-calorie malnutrition: Secondary | ICD-10-CM | POA: Diagnosis not present

## 2022-11-08 DIAGNOSIS — N1831 Chronic kidney disease, stage 3a: Secondary | ICD-10-CM | POA: Diagnosis not present

## 2023-04-29 DIAGNOSIS — H18413 Arcus senilis, bilateral: Secondary | ICD-10-CM | POA: Diagnosis not present

## 2023-04-29 DIAGNOSIS — Z961 Presence of intraocular lens: Secondary | ICD-10-CM | POA: Diagnosis not present

## 2023-04-29 DIAGNOSIS — H26493 Other secondary cataract, bilateral: Secondary | ICD-10-CM | POA: Diagnosis not present

## 2023-04-29 DIAGNOSIS — H16223 Keratoconjunctivitis sicca, not specified as Sjogren's, bilateral: Secondary | ICD-10-CM | POA: Diagnosis not present

## 2023-05-09 ENCOUNTER — Other Ambulatory Visit: Payer: Self-pay | Admitting: Internal Medicine

## 2023-05-09 DIAGNOSIS — Z0000 Encounter for general adult medical examination without abnormal findings: Secondary | ICD-10-CM | POA: Diagnosis not present

## 2023-05-09 DIAGNOSIS — G4700 Insomnia, unspecified: Secondary | ICD-10-CM | POA: Diagnosis not present

## 2023-05-09 DIAGNOSIS — Z23 Encounter for immunization: Secondary | ICD-10-CM | POA: Diagnosis not present

## 2023-05-09 DIAGNOSIS — I10 Essential (primary) hypertension: Secondary | ICD-10-CM | POA: Diagnosis not present

## 2023-05-09 DIAGNOSIS — M8580 Other specified disorders of bone density and structure, unspecified site: Secondary | ICD-10-CM

## 2023-05-09 DIAGNOSIS — E7800 Pure hypercholesterolemia, unspecified: Secondary | ICD-10-CM | POA: Diagnosis not present

## 2023-05-09 DIAGNOSIS — D696 Thrombocytopenia, unspecified: Secondary | ICD-10-CM | POA: Diagnosis not present

## 2023-05-09 DIAGNOSIS — N1831 Chronic kidney disease, stage 3a: Secondary | ICD-10-CM | POA: Diagnosis not present

## 2023-05-09 DIAGNOSIS — D61818 Other pancytopenia: Secondary | ICD-10-CM | POA: Diagnosis not present

## 2023-05-09 DIAGNOSIS — E46 Unspecified protein-calorie malnutrition: Secondary | ICD-10-CM | POA: Diagnosis not present

## 2023-05-16 ENCOUNTER — Other Ambulatory Visit: Payer: Self-pay | Admitting: Internal Medicine

## 2023-05-16 DIAGNOSIS — M8580 Other specified disorders of bone density and structure, unspecified site: Secondary | ICD-10-CM

## 2023-05-16 DIAGNOSIS — Z1231 Encounter for screening mammogram for malignant neoplasm of breast: Secondary | ICD-10-CM

## 2023-07-10 ENCOUNTER — Ambulatory Visit
Admission: RE | Admit: 2023-07-10 | Discharge: 2023-07-10 | Disposition: A | Discharge status: Home / Self Care | Payer: Medicare Other | Source: Ambulatory Visit | Attending: Internal Medicine | Admitting: Internal Medicine

## 2023-07-10 DIAGNOSIS — Z1231 Encounter for screening mammogram for malignant neoplasm of breast: Secondary | ICD-10-CM

## 2023-09-19 DIAGNOSIS — E7800 Pure hypercholesterolemia, unspecified: Secondary | ICD-10-CM | POA: Diagnosis not present

## 2023-09-19 DIAGNOSIS — E46 Unspecified protein-calorie malnutrition: Secondary | ICD-10-CM | POA: Diagnosis not present

## 2023-09-19 DIAGNOSIS — D61818 Other pancytopenia: Secondary | ICD-10-CM | POA: Diagnosis not present

## 2023-09-19 DIAGNOSIS — N1831 Chronic kidney disease, stage 3a: Secondary | ICD-10-CM | POA: Diagnosis not present

## 2023-09-19 DIAGNOSIS — D696 Thrombocytopenia, unspecified: Secondary | ICD-10-CM | POA: Diagnosis not present

## 2023-09-19 DIAGNOSIS — I10 Essential (primary) hypertension: Secondary | ICD-10-CM | POA: Diagnosis not present

## 2023-11-20 DIAGNOSIS — D696 Thrombocytopenia, unspecified: Secondary | ICD-10-CM | POA: Diagnosis not present

## 2023-11-20 DIAGNOSIS — N1831 Chronic kidney disease, stage 3a: Secondary | ICD-10-CM | POA: Diagnosis not present

## 2023-11-20 DIAGNOSIS — E46 Unspecified protein-calorie malnutrition: Secondary | ICD-10-CM | POA: Diagnosis not present

## 2023-11-20 DIAGNOSIS — I10 Essential (primary) hypertension: Secondary | ICD-10-CM | POA: Diagnosis not present

## 2024-01-09 ENCOUNTER — Other Ambulatory Visit: Payer: Medicare Other

## 2024-04-07 ENCOUNTER — Other Ambulatory Visit: Payer: Self-pay | Admitting: Internal Medicine

## 2024-04-07 DIAGNOSIS — Z1231 Encounter for screening mammogram for malignant neoplasm of breast: Secondary | ICD-10-CM

## 2024-07-12 ENCOUNTER — Ambulatory Visit
